# Patient Record
Sex: Female | Born: 1990 | Race: Black or African American | Hispanic: No | Marital: Single | Smoking: Former smoker
Health system: Southern US, Community
[De-identification: ages and names within clinical notes are randomized; demographics above are authoritative.]

## PROBLEM LIST (undated history)

## (undated) ENCOUNTER — Emergency Department (HOSPITAL_COMMUNITY): Admission: EM | Payer: PRIVATE HEALTH INSURANCE | Source: Home / Self Care

## (undated) DIAGNOSIS — D649 Anemia, unspecified: Secondary | ICD-10-CM

## (undated) HISTORY — PX: ADENOIDECTOMY: SUR15

---
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
## 2012-12-14 ENCOUNTER — Encounter (HOSPITAL_COMMUNITY): Payer: Self-pay | Admitting: Obstetrics and Gynecology

## 2012-12-14 ENCOUNTER — Inpatient Hospital Stay (HOSPITAL_COMMUNITY)
Admission: AD | Admit: 2012-12-14 | Discharge: 2012-12-14 | Disposition: A | Discharge status: Home / Self Care | Payer: Self-pay | Source: Ambulatory Visit | Attending: Obstetrics & Gynecology | Admitting: Obstetrics & Gynecology

## 2012-12-14 DIAGNOSIS — A499 Bacterial infection, unspecified: Secondary | ICD-10-CM

## 2012-12-14 DIAGNOSIS — N760 Acute vaginitis: Secondary | ICD-10-CM | POA: Insufficient documentation

## 2012-12-14 DIAGNOSIS — N949 Unspecified condition associated with female genital organs and menstrual cycle: Secondary | ICD-10-CM | POA: Insufficient documentation

## 2012-12-14 DIAGNOSIS — B9689 Other specified bacterial agents as the cause of diseases classified elsewhere: Secondary | ICD-10-CM | POA: Insufficient documentation

## 2012-12-14 LAB — URINALYSIS, ROUTINE W REFLEX MICROSCOPIC
Bilirubin Urine: NEGATIVE
Hgb urine dipstick: NEGATIVE
Ketones, ur: NEGATIVE mg/dL
Protein, ur: NEGATIVE mg/dL
Urobilinogen, UA: 0.2 mg/dL (ref 0.0–1.0)

## 2012-12-14 LAB — WET PREP, GENITAL: Trich, Wet Prep: NONE SEEN

## 2012-12-14 MED ORDER — METRONIDAZOLE 500 MG PO TABS: 500.0000 mg | ORAL_TABLET | Freq: Two times a day (BID) | ORAL | Status: DC

## 2012-12-14 NOTE — MAU Provider Note (Signed)
History     CSN: [id number]  Arrival date and time: 12/14/12 2841   None     Chief Complaint  Patient presents with  . STD eval    . Vaginitis   HPI [name] is a 22 y.o. G0P0 who presents to MAU today for STD check. The patient states that she had unprotected intercourse last Thursday. She has noticed a thin, milky discharge recently without odor. She denies fever, abdominal pain, N/V or abnormal bleeding.   OB History    Grav Para Term Preterm Abortions TAB SAB Ect Mult Living   0 0 0 0 0 0 0 0 0 0       Past Medical History  Diagnosis Date  . Asthma   . Anemia     Past Surgical History  Procedure Date  . Adenoidectomy     History reviewed. No pertinent family history.  History  Substance Use Topics  . Smoking status: Former Games developer  . Smokeless tobacco: Not on file  . Alcohol Use: No    Allergies:  Allergies  Allergen Reactions  . Penicillins Other (See Comments)    Pt unsure of reaction    No prescriptions prior to admission    ROS All negative unless otherwise noted in HPI Physical Exam   Blood pressure 134/76, pulse 79, temperature 98.3 F (36.8 C), temperature source Oral, resp. rate 16, height 5\' 1"  (1.549 m), weight 168 lb (76.204 kg), last menstrual period 11/27/2012.  Physical Exam  Constitutional: She is oriented to person, place, and time. She appears well-developed and well-nourished. No distress.  HENT:  Head: Normocephalic.  Cardiovascular: Normal rate, regular rhythm and normal heart sounds.   Respiratory: Effort normal and breath sounds normal. No respiratory distress.  GI: Soft. Bowel sounds are normal. She exhibits no distension and no mass. There is no tenderness. There is no rebound and no guarding.  Genitourinary: Vagina normal. Uterus is not enlarged and not tender. Cervix exhibits discharge (frothy, thin, whitish, yellow discharge noted at the cervical os) and friability (mild fribility noted on exam. no active  bleeding). Cervix exhibits no motion tenderness. Right adnexum displays no mass and no tenderness. Left adnexum displays no mass and no tenderness.  Neurological: She is alert and oriented to person, place, and time.  Skin: Skin is warm and dry. No erythema.  Psychiatric: She has a normal mood and affect.   Results for orders placed during the hospital encounter of 12/14/12 (from the past 24 hour(s))  URINALYSIS, ROUTINE W REFLEX MICROSCOPIC     Status: Normal   Collection Time   12/14/12 10:00 AM      Component Value Range   Color, Urine YELLOW  YELLOW   APPearance CLEAR  CLEAR   Specific Gravity, Urine 1.020  1.005 - 1.030   pH 6.0  5.0 - 8.0   Glucose, UA NEGATIVE  NEGATIVE mg/dL   Hgb urine dipstick NEGATIVE  NEGATIVE   Bilirubin Urine NEGATIVE  NEGATIVE   Ketones, ur NEGATIVE  NEGATIVE mg/dL   Protein, ur NEGATIVE  NEGATIVE mg/dL   Urobilinogen, UA 0.2  0.0 - 1.0 mg/dL   Nitrite NEGATIVE  NEGATIVE   Leukocytes, UA NEGATIVE  NEGATIVE  WET PREP, GENITAL     Status: Abnormal   Collection Time   12/14/12 10:52 AM      Component Value Range   Yeast Wet Prep HPF POC NONE SEEN  NONE SEEN   Trich, Wet Prep NONE SEEN  NONE SEEN  Clue Cells Wet Prep HPF POC FEW (*) NONE SEEN   WBC, Wet Prep HPF POC MODERATE BACTERIA SEEN (*) NONE SEEN    MAU Course  Procedures None  MDM Wet prep and GC/Chlamydia obtained   Assessment and Plan  A: Bacterial vaginosis  P: Discharge home Patient given contact information for [facility] for additional STD testing GC/Chlamydia pending. Patient aware that she will be contacted with a positive result only Patient encouraged to find a PCP Patient may return to MAU as needed  Freddi Starr, PA-C 12/14/2012, 10:39 AM

## 2012-12-14 NOTE — MAU Note (Signed)
Pt states she was having intercourse last Thursday and her partner took condom off and wants to be evaluated for STDs.

## 2012-12-16 LAB — GC/CHLAMYDIA PROBE AMP
CT Probe RNA: NEGATIVE
GC Probe RNA: NEGATIVE

## 2013-10-06 ENCOUNTER — Other Ambulatory Visit (HOSPITAL_COMMUNITY)
Admission: RE | Admit: 2013-10-06 | Discharge: 2013-10-06 | Disposition: A | Discharge status: Home / Self Care | Payer: PRIVATE HEALTH INSURANCE | Source: Ambulatory Visit | Attending: Emergency Medicine | Admitting: Emergency Medicine

## 2013-10-06 ENCOUNTER — Encounter (HOSPITAL_COMMUNITY): Payer: Self-pay | Admitting: Emergency Medicine

## 2013-10-06 ENCOUNTER — Emergency Department (HOSPITAL_COMMUNITY)
Admission: EM | Admit: 2013-10-06 | Discharge: 2013-10-06 | Disposition: A | Discharge status: Home / Self Care | Payer: PRIVATE HEALTH INSURANCE | Source: Home / Self Care | Attending: Emergency Medicine | Admitting: Emergency Medicine

## 2013-10-06 DIAGNOSIS — K5900 Constipation, unspecified: Secondary | ICD-10-CM

## 2013-10-06 DIAGNOSIS — S335XXA Sprain of ligaments of lumbar spine, initial encounter: Secondary | ICD-10-CM

## 2013-10-06 DIAGNOSIS — Z113 Encounter for screening for infections with a predominantly sexual mode of transmission: Secondary | ICD-10-CM | POA: Insufficient documentation

## 2013-10-06 DIAGNOSIS — N760 Acute vaginitis: Secondary | ICD-10-CM

## 2013-10-06 DIAGNOSIS — K2970 Gastritis, unspecified, without bleeding: Secondary | ICD-10-CM

## 2013-10-06 DIAGNOSIS — S39012A Strain of muscle, fascia and tendon of lower back, initial encounter: Secondary | ICD-10-CM

## 2013-10-06 DIAGNOSIS — B9689 Other specified bacterial agents as the cause of diseases classified elsewhere: Secondary | ICD-10-CM

## 2013-10-06 DIAGNOSIS — A499 Bacterial infection, unspecified: Secondary | ICD-10-CM

## 2013-10-06 LAB — POCT I-STAT, CHEM 8
BUN: 12 mg/dL (ref 6–23)
Hemoglobin: 15.3 g/dL — ABNORMAL HIGH (ref 12.0–15.0)
Potassium: 3.7 mEq/L (ref 3.5–5.1)
Sodium: 142 mEq/L (ref 135–145)
TCO2: 24 mmol/L (ref 0–100)

## 2013-10-06 LAB — POCT URINALYSIS DIP (DEVICE)
Bilirubin Urine: NEGATIVE
Ketones, ur: NEGATIVE mg/dL
Protein, ur: NEGATIVE mg/dL

## 2013-10-06 LAB — POCT PREGNANCY, URINE: Preg Test, Ur: NEGATIVE

## 2013-10-06 MED ORDER — CYCLOBENZAPRINE HCL 5 MG PO TABS: 5.0000 mg | ORAL_TABLET | Freq: Three times a day (TID) | ORAL | Status: DC | PRN

## 2013-10-06 MED ORDER — OMEPRAZOLE 20 MG PO CPDR: 20.0000 mg | DELAYED_RELEASE_CAPSULE | Freq: Two times a day (BID) | ORAL | Status: DC

## 2013-10-06 MED ORDER — METRONIDAZOLE 500 MG PO TABS: 500.0000 mg | ORAL_TABLET | Freq: Two times a day (BID) | ORAL | Status: DC

## 2013-10-06 MED ORDER — POLYETHYLENE GLYCOL 3350 17 GM/SCOOP PO POWD: 17.0000 g | Freq: Every day | ORAL | Status: DC

## 2013-10-06 NOTE — ED Notes (Signed)
Pelvic equipment at bedside, patient instructed to put on gown 

## 2013-10-06 NOTE — ED Provider Notes (Signed)
Chief Complaint:   Chief Complaint  Patient presents with  . Abdominal Pain  . Back Pain    History of Present Illness:    Amy Pacheco is a 22 year old female who for the past month has had continuous nausea throughout the day and has lost about 10 pounds. She's been constipated only having about one to 2 bowel movements per week which are hard and pellet-like. She does not have to strain. She denies any blood in the stool. She has had no vomiting. She feels hunger pains in the epigastric area. She denies any other abdominal pain. The symptoms came on after the following weekend in which she and dull is heavily in alcohol. She has not had any alcohol since then, but the symptoms have persisted. She does not smoke cigarettes but does occasionally smoke marijuana. No use of aspirin, or anti-inflammatories. She was drinking coffee but stopped this. Also for the past 1-1/2 weeks she's had pain in her lower back. This came on after gym workout. It's localized to the right lower back. It hurts with bending and twisting. There is no radiation to the legs, numbness, tingling, or weakness. She has swelling of her ankles and feet for the past 2 weeks. Her urine smells strong. She's had some discharge and odor. Last menstrual period was October 29. She is sexually active without use of birth control. She denies any history of ulcer disease or gastritis.  Review of Systems:  Other than noted above, the patient denies any of the following symptoms: Constitutional:  No fever, chills, fatigue, weight loss or anorexia. Lungs:  No cough or shortness of breath. Heart:  No chest pain, palpitations, syncope or edema.  No cardiac history. Abdomen:  No nausea, vomiting, hematememesis, melena, diarrhea, or hematochezia. GU:  No dysuria, frequency, urgency, or hematuria. Gyn:  No vaginal discharge, itching, abnormal bleeding, dyspareunia, or pelvic pain.  PMFSH:  Past medical history, family history, social history, meds,  and allergies were reviewed along with nurse's notes.  No prior abdominal surgeries, past history of GI problems, STDs or GYN problems.  No history of aspirin or NSAID use.  No excessive alcohol intake. She is allergic to penicillin.  Physical Exam:   Vital signs:  BP 109/68  Pulse 64  Temp(Src) 98.8 F (37.1 C) (Oral)  Resp 16  SpO2 100%  LMP 09/10/2013 Gen:  Alert, oriented, in no distress. Lungs:  Breath sounds clear and equal bilaterally.  No wheezes, rales or rhonchi. Heart:  Regular rhythm.  No gallops or murmers.   Abdomen:  Soft, flat, nondistended. No organomegaly or mass. Bowel sounds are normally active. No tenderness, guarding, rebound. Pelvic:  Normal external genitalia. Vaginal and cervical mucosa were normal. There was a small amount of yellow, malodorous discharge. There was no pain on cervical motion. Uterus was normal in size and shape and nontender. No adnexal tenderness or mass. DNA probes for gonorrhea, Chlamydia, Trichomonas, Gardnerella, Candida were obtained. In Skin:  Clear, warm and dry.  No rash.  Labs:   Results for orders placed during the hospital encounter of 10/06/13  POCT URINALYSIS DIP (DEVICE)      Result Value Range   Glucose, UA NEGATIVE  NEGATIVE mg/dL   Bilirubin Urine NEGATIVE  NEGATIVE   Ketones, ur NEGATIVE  NEGATIVE mg/dL   Specific Gravity, Urine >=1.030  1.005 - 1.030   Hgb urine dipstick NEGATIVE  NEGATIVE   pH 6.5  5.0 - 8.0   Protein, ur NEGATIVE  NEGATIVE mg/dL  Urobilinogen, UA 0.2  0.0 - 1.0 mg/dL   Nitrite NEGATIVE  NEGATIVE   Leukocytes, UA TRACE (*) NEGATIVE  POCT PREGNANCY, URINE      Result Value Range   Preg Test, Ur NEGATIVE  NEGATIVE  POCT H PYLORI SCREEN      Result Value Range   H. PYLORI SCREEN, POC NEGATIVE  NEGATIVE  POCT I-STAT, CHEM 8      Result Value Range   Sodium 142  135 - 145 mEq/L   Potassium 3.7  3.5 - 5.1 mEq/L   Chloride 104  96 - 112 mEq/L   BUN 12  6 - 23 mg/dL   Creatinine, Ser 1.61  0.50 - 1.10  mg/dL   Glucose, Bld 74  70 - 99 mg/dL   Calcium, Ion 0.96 (*) 1.12 - 1.23 mmol/L   TCO2 24  0 - 100 mmol/L   Hemoglobin 15.3 (*) 12.0 - 15.0 g/dL   HCT 04.5  40.9 - 81.1 %    Assessment:  The primary encounter diagnosis was Gastritis. Diagnoses of Lumbar strain, initial encounter, Bacterial vaginosis, and Constipation were also pertinent to this visit.  Differential diagnoses of her nausea and epigastric discomfort is ulcer versus gastritis. She'll need to see a GI doctor her primary care Dr. in followup. 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 She prefers to go there for followup.  Plan:   1.  Meds:  The following meds were prescribed:   Discharge Medication List as of 10/06/2013  1:58 PM    START taking these medications   Details  cyclobenzaprine (FLEXERIL) 5 MG tablet Take 1 tablet (5 mg total) by mouth 3 (three) times daily as needed for muscle spasms., Starting 10/06/2013, Until Discontinued, Normal    !! metroNIDAZOLE (FLAGYL) 500 MG tablet Take 1 tablet (500 mg total) by mouth 2 (two) times daily., Starting 10/06/2013, Until Discontinued, Normal    omeprazole (PRILOSEC) 20 MG capsule Take 1 capsule (20 mg total) by mouth 2 (two) times daily before a meal., Starting 10/06/2013, Until Discontinued, Normal    polyethylene glycol powder (MIRALAX) powder Take 17 g by mouth daily., Starting 10/06/2013, Until Discontinued, Normal     !! - Potential duplicate medications found. Please discuss with provider.      2.  Patient Education/Counseling:  The patient was given appropriate handouts, self care instructions, and instructed in symptomatic relief.    3.  Follow up:  The patient was told to follow up if no better in 3 to 4 days, if becoming worse in any way, and given some red flag symptoms such as worsening abdominal pain or persistent vomiting or any evidence of GI bleeding which would prompt immediate return.  Follow up here if  necessary.    Reuben Likes, MD 10/06/13 [phone]

## 2013-10-06 NOTE — ED Notes (Signed)
Patient reports multiple complaints.  Onset around halloween of nausea, no vomiting, no diarrhea.  Patient also reports last bm approx 2 weeks ago, normally has bm 2 times/week.  Patient also has right lower back pain, no burning with urination , but has an odor, and reports vaginal discharge.

## 2013-10-07 ENCOUNTER — Telehealth (HOSPITAL_COMMUNITY): Payer: Self-pay | Admitting: Emergency Medicine

## 2013-10-07 ENCOUNTER — Telehealth (HOSPITAL_COMMUNITY): Payer: Self-pay | Admitting: *Deleted

## 2013-10-07 MED ORDER — AZITHROMYCIN 500 MG PO TABS: 1000.0000 mg | ORAL_TABLET | Freq: Once | ORAL | Status: DC

## 2013-10-07 NOTE — ED Notes (Signed)
GC neg., Chlamydia pos., Affirm: Candida and Trich neg., Gardnerella pos. Pt. adequately treated with Flagyl.  Dr. Lorenz Coaster e-prescribed Zithromax to [facility] on [address]. I called pt. Pt. verified x 2 and given results.  Pt. told she was adequately treated for bacterial vaginosis with Flagyl and needs Zithromax for Chlamydia. Pt. instructed to notify her partner, no sex for 1 week and to practice safe sex. Pt. told she can get HIV testing at the [facility]. STD clinic, by appointment.  DHHS form completed and faxed to the [facility] Department. Vassie Moselle 10/07/2013

## 2013-10-07 NOTE — ED Notes (Signed)
The patient's DNA probe came back positive for Chlamydia. She was not treated for this. We will send in a prescription to her pharmacy for azithromycin 500 mg, #2 tablets, to take 2 at 1 time. Would suggest a repeat DNA probe in 2 weeks, and for her partner, and we'll need to report to the health department.  Reuben Likes, MD 10/07/13 [phone]

## 2014-03-18 ENCOUNTER — Emergency Department (INDEPENDENT_AMBULATORY_CARE_PROVIDER_SITE_OTHER)
Admission: EM | Admit: 2014-03-18 | Discharge: 2014-03-18 | Disposition: A | Discharge status: Home / Self Care | Payer: PRIVATE HEALTH INSURANCE | Source: Home / Self Care | Attending: Family Medicine | Admitting: Family Medicine

## 2014-03-18 ENCOUNTER — Encounter (HOSPITAL_COMMUNITY): Payer: Self-pay | Admitting: Emergency Medicine

## 2014-03-18 DIAGNOSIS — K2920 Alcoholic gastritis without bleeding: Secondary | ICD-10-CM

## 2014-03-18 MED ORDER — SODIUM CHLORIDE 0.9 % IV SOLN
Freq: Once | INTRAVENOUS | Status: AC
  Administered 2014-03-18: 16:00:00 via INTRAVENOUS

## 2014-03-18 MED ORDER — ONDANSETRON HCL 4 MG/2ML IJ SOLN
4.0000 mg | Freq: Once | INTRAMUSCULAR | Status: AC
  Administered 2014-03-18: 4 mg via INTRAVENOUS

## 2014-03-18 MED ORDER — GI COCKTAIL ~~LOC~~
30.0000 mL | Freq: Once | ORAL | Status: AC
  Administered 2014-03-18: 30 mL via ORAL

## 2014-03-18 MED ORDER — GI COCKTAIL ~~LOC~~
ORAL | Status: AC
  Filled 2014-03-18: qty 30

## 2014-03-18 MED ORDER — ONDANSETRON 4 MG PO TBDP: 8.0000 mg | ORAL_TABLET | Freq: Once | ORAL | Status: DC

## 2014-03-18 MED ORDER — ONDANSETRON HCL 4 MG/2ML IJ SOLN
INTRAMUSCULAR | Status: AC
  Filled 2014-03-18: qty 2

## 2014-03-18 MED ORDER — RANITIDINE HCL 150 MG PO TABS: 150.0000 mg | ORAL_TABLET | Freq: Two times a day (BID) | ORAL | Status: DC

## 2014-03-18 NOTE — ED Provider Notes (Signed)
CSN: [id number]     Arrival date & time 03/18/14  1440 History   First MD Initiated Contact with Patient 03/18/14 1511     Chief Complaint  Patient presents with  . Alcohol Problem   (Consider location/radiation/quality/duration/timing/severity/associated sxs/prior Treatment) Patient is a 23 y.o. female presenting with alcohol problem. The history is provided by the patient.  Alcohol Problem This is a new problem. The current episode started 6 to 12 hours ago (this am after heavy drinking binge yest into night. no blood.). The problem occurs constantly. Pertinent negatives include no abdominal pain.    Past Medical History  Diagnosis Date  . Asthma   . Anemia    Past Surgical History  Procedure Laterality Date  . Adenoidectomy     History reviewed. No pertinent family history. History  Substance Use Topics  . Smoking status: Former Research scientist (life sciences)  . Smokeless tobacco: Not on file  . Alcohol Use: Yes     Comment: binge consumption   OB History   Grav Para Term Preterm Abortions TAB SAB Ect Mult Living   0 0 0 0 0 0 0 0 0 0      Review of Systems  Constitutional: Positive for appetite change. Negative for fever.  Gastrointestinal: Positive for nausea and vomiting. Negative for abdominal pain, diarrhea and blood in stool.    Allergies  Penicillins  Home Medications   Prior to Admission medications   Medication Sig Start Date End Date Taking? Authorizing Provider  azithromycin (ZITHROMAX) 500 MG tablet Take 2 tablets (1,000 mg total) by mouth once. 10/07/13   Harden Mo, MD  cyclobenzaprine (FLEXERIL) 5 MG tablet Take 1 tablet (5 mg total) by mouth 3 (three) times daily as needed for muscle spasms. 10/06/13   Harden Mo, MD  metroNIDAZOLE (FLAGYL) 500 MG tablet Take 1 tablet (500 mg total) by mouth 2 (two) times daily. 12/14/12   Farris Has, PA-C  metroNIDAZOLE (FLAGYL) 500 MG tablet Take 1 tablet (500 mg total) by mouth 2 (two) times daily. 10/06/13   Harden Mo, MD   omeprazole (PRILOSEC) 20 MG capsule Take 1 capsule (20 mg total) by mouth 2 (two) times daily before a meal. 10/06/13   Harden Mo, MD  polyethylene glycol powder (MIRALAX) powder Take 17 g by mouth daily. 10/06/13   Harden Mo, MD   BP 125/83  Pulse 115  Temp(Src) 98.1 F (36.7 C) (Oral)  Resp 19  SpO2 100% Physical Exam  Nursing note and vitals reviewed. Constitutional: She is oriented to person, place, and time. She appears well-developed and well-nourished. She appears distressed.  HENT:  Mouth/Throat: Oropharynx is clear and moist.  Neck: Normal range of motion. Neck supple.  Abdominal: Soft. Bowel sounds are normal. She exhibits no mass. There is tenderness. There is no rebound and no guarding.  Lymphadenopathy:    She has no cervical adenopathy.  Neurological: She is alert and oriented to person, place, and time.  Skin: Skin is warm and dry.    ED Course  Procedures (including critical care time) Labs Review Labs Reviewed - No data to display  Imaging Review No results found.   MDM   1. Gastritis, alcoholic    Tolerating po and sx improved at time of d/c.    Billy Fischer, MD 03/18/14 [phone]

## 2014-03-18 NOTE — ED Notes (Signed)
Reportedly consumed couple of beers and did a few shots yesterday PM in a group. No one else in group ill. C/o has not  Been able to hold down any fluids

## 2014-03-18 NOTE — Discharge Instructions (Signed)
Use medicine as prescribed, you MUST reduce alcohol intake or problems WILL get worse.

## 2014-11-28 ENCOUNTER — Emergency Department (HOSPITAL_COMMUNITY): Payer: No Typology Code available for payment source

## 2014-11-28 ENCOUNTER — Emergency Department (HOSPITAL_COMMUNITY)
Admission: EM | Admit: 2014-11-28 | Discharge: 2014-11-28 | Disposition: A | Discharge status: Home / Self Care | Payer: No Typology Code available for payment source | Attending: Emergency Medicine | Admitting: Emergency Medicine

## 2014-11-28 ENCOUNTER — Encounter (HOSPITAL_COMMUNITY): Payer: Self-pay | Admitting: *Deleted

## 2014-11-28 DIAGNOSIS — N939 Abnormal uterine and vaginal bleeding, unspecified: Secondary | ICD-10-CM | POA: Insufficient documentation

## 2014-11-28 DIAGNOSIS — Z3202 Encounter for pregnancy test, result negative: Secondary | ICD-10-CM | POA: Insufficient documentation

## 2014-11-28 DIAGNOSIS — M545 Low back pain: Secondary | ICD-10-CM | POA: Insufficient documentation

## 2014-11-28 DIAGNOSIS — Z880 Allergy status to penicillin: Secondary | ICD-10-CM | POA: Insufficient documentation

## 2014-11-28 DIAGNOSIS — Z87891 Personal history of nicotine dependence: Secondary | ICD-10-CM | POA: Insufficient documentation

## 2014-11-28 DIAGNOSIS — Z862 Personal history of diseases of the blood and blood-forming organs and certain disorders involving the immune mechanism: Secondary | ICD-10-CM | POA: Insufficient documentation

## 2014-11-28 DIAGNOSIS — R1030 Lower abdominal pain, unspecified: Secondary | ICD-10-CM

## 2014-11-28 DIAGNOSIS — J45909 Unspecified asthma, uncomplicated: Secondary | ICD-10-CM | POA: Insufficient documentation

## 2014-11-28 LAB — CBC WITH DIFFERENTIAL/PLATELET
BASOS ABS: 0.1 10*3/uL (ref 0.0–0.1)
Basophils Relative: 1 % (ref 0–1)
EOS ABS: 0.1 10*3/uL (ref 0.0–0.7)
Eosinophils Relative: 1 % (ref 0–5)
HCT: 38.1 % (ref 36.0–46.0)
Hemoglobin: 12.8 g/dL (ref 12.0–15.0)
LYMPHS ABS: 1.7 10*3/uL (ref 0.7–4.0)
Lymphocytes Relative: 24 % (ref 12–46)
MCH: 28.8 pg (ref 26.0–34.0)
MCHC: 33.6 g/dL (ref 30.0–36.0)
MCV: 85.6 fL (ref 78.0–100.0)
MONO ABS: 0.6 10*3/uL (ref 0.1–1.0)
Monocytes Relative: 8 % (ref 3–12)
NEUTROS ABS: 4.7 10*3/uL (ref 1.7–7.7)
Neutrophils Relative %: 66 % (ref 43–77)
Platelets: 454 10*3/uL — ABNORMAL HIGH (ref 150–400)
RBC: 4.45 MIL/uL (ref 3.87–5.11)
RDW: 15.8 % — ABNORMAL HIGH (ref 11.5–15.5)
WBC: 7.1 10*3/uL (ref 4.0–10.5)

## 2014-11-28 LAB — COMPREHENSIVE METABOLIC PANEL
ALBUMIN: 4.1 g/dL (ref 3.5–5.2)
ALK PHOS: 56 U/L (ref 39–117)
ALT: 17 U/L (ref 0–35)
ANION GAP: 5 (ref 5–15)
AST: 20 U/L (ref 0–37)
BUN: <5 mg/dL — ABNORMAL LOW (ref 6–23)
CALCIUM: 9.5 mg/dL (ref 8.4–10.5)
CHLORIDE: 105 meq/L (ref 96–112)
CO2: 28 mmol/L (ref 19–32)
Creatinine, Ser: 0.68 mg/dL (ref 0.50–1.10)
GFR calc non Af Amer: >90 mL/min (ref >90)
Glucose, Bld: 106 mg/dL — ABNORMAL HIGH (ref 70–99)
Potassium: 3.8 mmol/L (ref 3.5–5.1)
SODIUM: 138 mmol/L (ref 135–145)
TOTAL PROTEIN: 7.7 g/dL (ref 6.0–8.3)
Total Bilirubin: 1 mg/dL (ref 0.3–1.2)

## 2014-11-28 LAB — URINALYSIS, ROUTINE W REFLEX MICROSCOPIC
Bilirubin Urine: NEGATIVE
Glucose, UA: NEGATIVE mg/dL
NITRITE: NEGATIVE
PROTEIN: NEGATIVE mg/dL
Specific Gravity, Urine: 1.028 (ref 1.005–1.030)
UROBILINOGEN UA: 0.2 mg/dL (ref 0.0–1.0)
pH: 5.5 (ref 5.0–8.0)

## 2014-11-28 LAB — PREGNANCY, URINE: PREG TEST UR: NEGATIVE

## 2014-11-28 LAB — URINE MICROSCOPIC-ADD ON

## 2014-11-28 LAB — WET PREP, GENITAL
CLUE CELLS WET PREP: NONE SEEN
Trich, Wet Prep: NONE SEEN
Yeast Wet Prep HPF POC: NONE SEEN

## 2014-11-28 LAB — LIPASE, BLOOD: Lipase: 35 U/L (ref 11–59)

## 2014-11-28 MED ORDER — HYDROCODONE-ACETAMINOPHEN 5-325 MG PO TABS: 2.0000 | ORAL_TABLET | ORAL | Status: AC | PRN

## 2014-11-28 MED ORDER — IBUPROFEN 800 MG PO TABS
800.0000 mg | ORAL_TABLET | Freq: Once | ORAL | Status: AC
  Administered 2014-11-28: 800 mg via ORAL
  Filled 2014-11-28: qty 1

## 2014-11-28 MED ORDER — HYDROCODONE-ACETAMINOPHEN 5-325 MG PO TABS
2.0000 | ORAL_TABLET | Freq: Once | ORAL | Status: DC
  Filled 2014-11-28: qty 2

## 2014-11-28 MED ORDER — ONDANSETRON 4 MG PO TBDP
8.0000 mg | ORAL_TABLET | Freq: Once | ORAL | Status: AC
  Administered 2014-11-28: 8 mg via ORAL
  Filled 2014-11-28: qty 2

## 2014-11-28 MED ORDER — IBUPROFEN 800 MG PO TABS: 800.0000 mg | ORAL_TABLET | Freq: Three times a day (TID) | ORAL | Status: AC

## 2014-11-28 NOTE — ED Provider Notes (Signed)
CSN: [id number]     Arrival date & time 11/28/14  0320 History   First MD Initiated Contact with Patient 11/28/14 [phone]     Chief Complaint  Patient presents with  . Abdominal Pain     (Consider location/radiation/quality/duration/timing/severity/associated sxs/prior Treatment) HPI Comments: Patient is a 24 year old female with no past medical history who presents with lower abdominal pain that started yesterday. The pain is located in her lower abdomen and does not radiate. Patient reports her menstrual cycle started yesterday. The pain is described as cramping and severe. The pain started gradually and progressively worsened since the onset. No alleviating/aggravating factors. The patient has tried tylenol for symptoms without relief. Associated symptoms include back pain and heavy vaginal bleeding. Patient denies fever, headache, NVD, chest pain, SOB, dysuria, constipation.    Past Medical History  Diagnosis Date  . Asthma   . Anemia    Past Surgical History  Procedure Laterality Date  . Adenoidectomy     No family history on file. History  Substance Use Topics  . Smoking status: Former Research scientist (life sciences)  . Smokeless tobacco: Not on file  . Alcohol Use: Yes     Comment: binge consumption   OB History    Gravida Para Term Preterm AB TAB SAB Ectopic Multiple Living   0 0 0 0 0 0 0 0 0 0      Review of Systems  Constitutional: Negative for fever, chills and fatigue.  HENT: Negative for trouble swallowing.   Eyes: Negative for visual disturbance.  Respiratory: Negative for shortness of breath.   Cardiovascular: Negative for chest pain and palpitations.  Gastrointestinal: Positive for abdominal pain. Negative for nausea, vomiting and diarrhea.  Genitourinary: Positive for vaginal bleeding. Negative for dysuria and difficulty urinating.  Musculoskeletal: Positive for back pain. Negative for arthralgias and neck pain.  Skin: Negative for color change.  Neurological: Negative for dizziness  and weakness.  Psychiatric/Behavioral: Negative for dysphoric mood.      Allergies  Penicillins  Home Medications   Prior to Admission medications   Medication Sig Start Date End Date Taking? Authorizing Provider  azithromycin (ZITHROMAX) 500 MG tablet Take 2 tablets (1,000 mg total) by mouth once. Patient not taking: Reported on 11/28/2014 10/07/13   Harden Mo, MD  cyclobenzaprine (FLEXERIL) 5 MG tablet Take 1 tablet (5 mg total) by mouth 3 (three) times daily as needed for muscle spasms. Patient not taking: Reported on 11/28/2014 10/06/13   Harden Mo, MD  metroNIDAZOLE (FLAGYL) 500 MG tablet Take 1 tablet (500 mg total) by mouth 2 (two) times daily. Patient not taking: Reported on 11/28/2014 12/14/12   Luvenia Redden, PA-C  metroNIDAZOLE (FLAGYL) 500 MG tablet Take 1 tablet (500 mg total) by mouth 2 (two) times daily. Patient not taking: Reported on 11/28/2014 10/06/13   Harden Mo, MD  omeprazole (PRILOSEC) 20 MG capsule Take 1 capsule (20 mg total) by mouth 2 (two) times daily before a meal. Patient not taking: Reported on 11/28/2014 10/06/13   Harden Mo, MD  polyethylene glycol powder (MIRALAX) powder Take 17 g by mouth daily. Patient not taking: Reported on 11/28/2014 10/06/13   Harden Mo, MD  ranitidine (ZANTAC) 150 MG tablet Take 1 tablet (150 mg total) by mouth 2 (two) times daily. Patient not taking: Reported on 11/28/2014 03/18/14   Billy Fischer, MD   BP 134/86 mmHg  Pulse 68  Temp(Src) 98.2 F (36.8 C) (Oral)  Resp 22  Ht 5' (1.524  m)  Wt 180 lb (81.647 kg)  BMI 35.15 kg/m2  SpO2 100%  LMP 11/28/2014 Physical Exam  Constitutional: She is oriented to person, place, and time. She appears well-developed and well-nourished. No distress.  HENT:  Head: Normocephalic and atraumatic.  Eyes: Conjunctivae and EOM are normal.  Neck: Normal range of motion.  Cardiovascular: Normal rate and regular rhythm.  Exam reveals no gallop and no friction rub.   No  murmur heard. Pulmonary/Chest: Effort normal and breath sounds normal. She has no wheezes. She has no rales. She exhibits no tenderness.  Abdominal: Soft. She exhibits no distension. There is tenderness. There is no rebound and no guarding.  Generalized lower abdominal tenderness to palpation. No focal tenderness.   Genitourinary: Vagina normal.  Normal external genitalia. Copious blood in vaginal vault. No CMT. Cervical os closed. Generalized tenderness to palpation on bimanual exam. No focal tenderness or masses palpated.   No CVA tenderness.  Musculoskeletal: Normal range of motion.  Neurological: She is alert and oriented to person, place, and time. Coordination normal.  Speech is goal-oriented. Moves limbs without ataxia.   Skin: Skin is warm and dry.  Psychiatric: She has a normal mood and affect. Her behavior is normal.  Nursing note and vitals reviewed.   ED Course  Procedures (including critical care time) Labs Review Labs Reviewed  WET PREP, GENITAL - Abnormal; Notable for the following:    WBC, Wet Prep HPF POC FEW (*)    All other components within normal limits  CBC WITH DIFFERENTIAL - Abnormal; Notable for the following:    RDW 15.8 (*)    Platelets 454 (*)    All other components within normal limits  COMPREHENSIVE METABOLIC PANEL - Abnormal; Notable for the following:    Glucose, Bld 106 (*)    BUN <5 (*)    All other components within normal limits  URINALYSIS, ROUTINE W REFLEX MICROSCOPIC - Abnormal; Notable for the following:    Color, Urine AMBER (*)    APPearance TURBID (*)    Hgb urine dipstick LARGE (*)    Ketones, ur >80 (*)    Leukocytes, UA TRACE (*)    All other components within normal limits  LIPASE, BLOOD  PREGNANCY, URINE  URINE MICROSCOPIC-ADD ON  GC/CHLAMYDIA PROBE AMP ([place])    Imaging Review No results found.   EKG Interpretation None      MDM   Final diagnoses:  Lower abdominal pain  Episode of heavy vaginal bleeding     5:37 AM Labs unremarkable for acute changes. Patient's pelvic exam shows copious blood. Patient will have pelvis US. Vitals stable and patient afebrile.    Patient signed out to [facility], PA-C pending pelvic US.   Alvina Chou, PA-C 11/30/14 [place], MD 12/02/14 [phone]

## 2014-11-28 NOTE — ED Notes (Signed)
Pelvic cart at bedside. 

## 2014-11-28 NOTE — Discharge Instructions (Signed)
Is important if you to follow-up with primary care and GYN for further evaluation and management of your symptoms. Please take your Motrin regularly as prescribed for discomfort he may experience. He may take your pain medicine for severe pain. Return to ED for worsening symptoms.

## 2014-11-28 NOTE — ED Provider Notes (Signed)
Patient signed out to me by [facility]. Pt here for heavy menstrual bleeding. Plan is to follow-up on vaginal ultrasound, if normal patient may be discharged home with pain medicines and referral to follow-up with GYN. Ultrasound shows evidence of large 8.5 cm heterogeneous uterine fibroid on the right side of the uterus, otherwise unremarkable. Will provide patient with anti-inflammatories, pain medicine and referral to follow up with GYN. Patient stable, in good condition and is appropriate for discharge Filed Vitals:   11/28/14 0325 11/28/14 0434 11/28/14 0435 11/28/14 0644  BP: 111/68 134/86  106/61  Pulse: 87  68 65  Temp: 98.2 F (36.8 C)     TempSrc: Oral     Resp: 22   14  Height: 5' (1.524 m)     Weight: 180 lb (81.647 kg)     SpO2: 98%  100% 99%     Verl Dicker, PA-C 11/28/14 [phone]

## 2014-11-28 NOTE — ED Notes (Signed)
Patient alert and oriented at discharge.  Patient verbalized understanding of discharge.  Patient ambulatory to the waiting room with this RN.

## 2014-11-28 NOTE — ED Notes (Signed)
The pts period started yesterday and she has had ab and back pain since then.  [facility] but the pain  Continues and she was not given med for pain

## 2014-11-30 LAB — GC/CHLAMYDIA PROBE AMP (~~LOC~~) NOT AT ARMC
CHLAMYDIA, DNA PROBE: NEGATIVE
NEISSERIA GONORRHEA: NEGATIVE

## 2014-12-02 ENCOUNTER — Encounter (HOSPITAL_COMMUNITY): Payer: Self-pay | Admitting: *Deleted

## 2014-12-02 ENCOUNTER — Emergency Department (HOSPITAL_COMMUNITY)
Admission: EM | Admit: 2014-12-02 | Discharge: 2014-12-02 | Disposition: A | Discharge status: Home / Self Care | Payer: No Typology Code available for payment source | Attending: Emergency Medicine | Admitting: Emergency Medicine

## 2014-12-02 DIAGNOSIS — Z87891 Personal history of nicotine dependence: Secondary | ICD-10-CM | POA: Diagnosis not present

## 2014-12-02 DIAGNOSIS — Z880 Allergy status to penicillin: Secondary | ICD-10-CM | POA: Diagnosis not present

## 2014-12-02 DIAGNOSIS — J45909 Unspecified asthma, uncomplicated: Secondary | ICD-10-CM | POA: Insufficient documentation

## 2014-12-02 DIAGNOSIS — D259 Leiomyoma of uterus, unspecified: Secondary | ICD-10-CM | POA: Diagnosis not present

## 2014-12-02 DIAGNOSIS — N938 Other specified abnormal uterine and vaginal bleeding: Secondary | ICD-10-CM

## 2014-12-02 DIAGNOSIS — Z791 Long term (current) use of non-steroidal anti-inflammatories (NSAID): Secondary | ICD-10-CM | POA: Insufficient documentation

## 2014-12-02 DIAGNOSIS — Z862 Personal history of diseases of the blood and blood-forming organs and certain disorders involving the immune mechanism: Secondary | ICD-10-CM | POA: Diagnosis not present

## 2014-12-02 DIAGNOSIS — R109 Unspecified abdominal pain: Secondary | ICD-10-CM | POA: Diagnosis present

## 2014-12-02 LAB — I-STAT BETA HCG BLOOD, ED (MC, WL, AP ONLY): I-stat hCG, quantitative: <5 m[IU]/mL (ref <5)

## 2014-12-02 LAB — CBC
HCT: 35.9 % — ABNORMAL LOW (ref 36.0–46.0)
Hemoglobin: 12.1 g/dL (ref 12.0–15.0)
MCH: 28.9 pg (ref 26.0–34.0)
MCHC: 33.7 g/dL (ref 30.0–36.0)
MCV: 85.7 fL (ref 78.0–100.0)
PLATELETS: 458 10*3/uL — AB (ref 150–400)
RBC: 4.19 MIL/uL (ref 3.87–5.11)
RDW: 15.7 % — ABNORMAL HIGH (ref 11.5–15.5)
WBC: 7.9 10*3/uL (ref 4.0–10.5)

## 2014-12-02 MED ORDER — OXYCODONE-ACETAMINOPHEN 5-325 MG PO TABS: 1.0000 | ORAL_TABLET | ORAL | Status: AC | PRN

## 2014-12-02 MED ORDER — OXYCODONE-ACETAMINOPHEN 5-325 MG PO TABS
2.0000 | ORAL_TABLET | Freq: Once | ORAL | Status: AC
  Administered 2014-12-02: 2 via ORAL
  Filled 2014-12-02: qty 2

## 2014-12-02 MED ORDER — MEDROXYPROGESTERONE ACETATE 10 MG PO TABS: 10.0000 mg | ORAL_TABLET | Freq: Every day | ORAL | Status: AC

## 2014-12-02 MED ORDER — ONDANSETRON 4 MG PO TBDP
4.0000 mg | ORAL_TABLET | Freq: Once | ORAL | Status: AC
  Administered 2014-12-02: 4 mg via ORAL
  Filled 2014-12-02: qty 1

## 2014-12-02 NOTE — ED Notes (Signed)
Patient states she was dx with fibroid tumor on Saturday.  She states she has had ongoing pain and passing blood clots.  She has been taking ibuprofen and vicodin.  Patient also reports she has had no appetite and some n/v.

## 2014-12-02 NOTE — ED Notes (Signed)
Patient reports no pain.  [facility] is [facility]

## 2014-12-02 NOTE — ED Provider Notes (Signed)
CSN: [id number]     Arrival date & time 12/02/14  1030 History   First MD Initiated Contact with Patient 12/02/14 1034     Chief Complaint  Patient presents with  . Abdominal Pain      HPI Patient reports that she was recently diagnosed with uterine fibroid in his had no follow-up with OB/GYN to this point.  She states that she has developed ongoing vaginal bleeding and passing small blood clots over the past several days.  She states decreased appetite with some nausea and vomiting.  She denies nausea at this time.  Her symptoms are mild to moderate in severity.  No lightheadedness or weakness.  She is not on an anticoagulant.  No other significant complaints.  No fevers or chills.  No back pain.  No vaginal discharge   Past Medical History  Diagnosis Date  . Asthma   . Anemia    Past Surgical History  Procedure Laterality Date  . Adenoidectomy     No family history on file. History  Substance Use Topics  . Smoking status: Former Research scientist (life sciences)  . Smokeless tobacco: Not on file  . Alcohol Use: Yes     Comment: binge consumption   OB History    Gravida Para Term Preterm AB TAB SAB Ectopic Multiple Living   0 0 0 0 0 0 0 0 0 0      Review of Systems  All other systems reviewed and are negative.     Allergies  Penicillins  Home Medications   Prior to Admission medications   Medication Sig Start Date End Date Taking? Authorizing Provider  azithromycin (ZITHROMAX) 500 MG tablet Take 2 tablets (1,000 mg total) by mouth once. Patient not taking: Reported on 11/28/2014 10/07/13   Harden Mo, MD  cyclobenzaprine (FLEXERIL) 5 MG tablet Take 1 tablet (5 mg total) by mouth 3 (three) times daily as needed for muscle spasms. Patient not taking: Reported on 11/28/2014 10/06/13   Harden Mo, MD  HYDROcodone-acetaminophen (NORCO/VICODIN) 5-325 MG per tablet Take 2 tablets by mouth every 4 (four) hours as needed for moderate pain or severe pain. 11/28/14   Viona Gilmore Cartner, PA-C   ibuprofen (ADVIL,MOTRIN) 800 MG tablet Take 1 tablet (800 mg total) by mouth 3 (three) times daily. 11/28/14   Verl Dicker, PA-C  medroxyPROGESTERone (PROVERA) 10 MG tablet Take 1 tablet (10 mg total) by mouth daily. 12/02/14   Hoy Morn, MD  metroNIDAZOLE (FLAGYL) 500 MG tablet Take 1 tablet (500 mg total) by mouth 2 (two) times daily. Patient not taking: Reported on 11/28/2014 12/14/12   Luvenia Redden, PA-C  metroNIDAZOLE (FLAGYL) 500 MG tablet Take 1 tablet (500 mg total) by mouth 2 (two) times daily. Patient not taking: Reported on 11/28/2014 10/06/13   Harden Mo, MD  omeprazole (PRILOSEC) 20 MG capsule Take 1 capsule (20 mg total) by mouth 2 (two) times daily before a meal. Patient not taking: Reported on 11/28/2014 10/06/13   Harden Mo, MD  oxyCODONE-acetaminophen (PERCOCET/ROXICET) 5-325 MG per tablet Take 1 tablet by mouth every 4 (four) hours as needed for severe pain. 12/02/14   Hoy Morn, MD  polyethylene glycol powder [facility]) powder Take 17 g by mouth daily. Patient not taking: Reported on 11/28/2014 10/06/13   Harden Mo, MD  ranitidine (ZANTAC) 150 MG tablet Take 1 tablet (150 mg total) by mouth 2 (two) times daily. Patient not taking: Reported on 11/28/2014 03/18/14   Billy Fischer,  MD   BP 118/62 mmHg  Pulse 87  Temp(Src) 99.2 F (37.3 C) (Oral)  Resp 28  Ht 5' (1.524 m)  Wt 174 lb (78.926 kg)  BMI 33.98 kg/m2  SpO2 100%  LMP 11/28/2014 Physical Exam  Constitutional: She is oriented to person, place, and time. She appears well-developed and well-nourished. No distress.  HENT:  Head: Normocephalic and atraumatic.  Eyes: EOM are normal.  Neck: Normal range of motion.  Cardiovascular: Normal rate, regular rhythm and normal heart sounds.   Pulmonary/Chest: Effort normal and breath sounds normal.  Abdominal: Soft. She exhibits no distension. There is no tenderness.  Musculoskeletal: Normal range of motion.  Neurological: She is alert and oriented  to person, place, and time.  Skin: Skin is warm and dry.  Psychiatric: She has a normal mood and affect. Judgment normal.  Nursing note and vitals reviewed.   ED Course  Procedures (including critical care time) Labs Review Labs Reviewed  CBC - Abnormal; Notable for the following:    HCT 35.9 (*)    RDW 15.7 (*)    Platelets 458 (*)    All other components within normal limits  I-STAT BETA HCG BLOOD, ED ([facility], [facility], [facility] ONLY)    Imaging Review No results found.   EKG Interpretation None      MDM   Final diagnoses:  Dysfunctional uterine bleeding  Uterine leiomyoma, unspecified location    Vital signs normal.  Hemoglobin normal.  Well-appearing.  Abdomen is nontender.  Not pregnant.  We'll place on a course of Provera.  OB/GYN follow-up    Hoy Morn, MD 12/02/14 1152

## 2014-12-02 NOTE — Discharge Instructions (Signed)
Abnormal Uterine Bleeding Abnormal uterine bleeding can affect women at various stages in life, including teenagers, women in their reproductive years, pregnant women, and women who have reached menopause. Several kinds of uterine bleeding are considered abnormal, including:  Bleeding or spotting between periods.   Bleeding after sexual intercourse.   Bleeding that is heavier or more than normal.   Periods that last longer than usual.  Bleeding after menopause.  Many cases of abnormal uterine bleeding are minor and simple to treat, while others are more serious. Any type of abnormal bleeding should be evaluated by your health care provider. Treatment will depend on the cause of the bleeding. HOME CARE INSTRUCTIONS Monitor your condition for any changes. The following actions may help to alleviate any discomfort you are experiencing:  Avoid the use of tampons and douches as directed by your health care provider.  Change your pads frequently. You should get regular pelvic exams and Pap tests. Keep all follow-up appointments for diagnostic tests as directed by your health care provider.  SEEK MEDICAL CARE IF:   Your bleeding lasts more than 1 week.   You feel dizzy at times.  SEEK IMMEDIATE MEDICAL CARE IF:   You pass out.   You are changing pads every 15 to 30 minutes.   You have abdominal pain.  You have a fever.   You become sweaty or weak.   You are passing large blood clots from the vagina.   You start to feel nauseous and vomit. MAKE SURE YOU:   Understand these instructions.  Will watch your condition.  Will get help right away if you are not doing well or get worse. Document Released: 10/30/2005 Document Revised: 11/04/2013 Document Reviewed: 05/29/2013 [facility] Patient Information 2015 [place], [place]. This information is not intended to replace advice given to you by your health care provider. Make sure you discuss any questions you have with your  health care provider. Uterine Fibroid A uterine fibroid is a growth (tumor) that occurs in your uterus. This type of tumor is not cancerous and does not spread out of the uterus. You can have one or many fibroids. Fibroids can vary in size, weight, and where they grow in the uterus. Some can become quite large. Most fibroids do not require medical treatment, but some can cause pain or heavy bleeding during and between periods. CAUSES  A fibroid is the result of a single uterine cell that keeps growing (unregulated), which is different than most cells in the human body. Most cells have a control mechanism that keeps them from reproducing without control.  SIGNS AND SYMPTOMS   Bleeding.  Pelvic pain and pressure.  Bladder problems due to the size of the fibroid.  Infertility and miscarriages depending on the size and location of the fibroid. DIAGNOSIS  Uterine fibroids are diagnosed through a physical exam. Your health care provider may feel the lumpy tumors during a pelvic exam. Ultrasonography may be done to get information regarding size, location, and number of tumors.  TREATMENT   Your health care provider may recommend watchful waiting. This involves getting the fibroid checked by your health care provider to see if it grows or shrinks.   Hormone treatment or an intrauterine device (IUD) may be prescribed.   Surgery may be needed to remove the fibroids (myomectomy) or the uterus (hysterectomy). This depends on your situation. When fibroids interfere with fertility and a woman wants to become pregnant, a health care provider may recommend having the fibroids removed.  HOME  CARE INSTRUCTIONS  Home care depends on how you were treated. In general:   Keep all follow-up appointments with your health care provider.   Only take over-the-counter or prescription medicines as directed by your health care provider. If you were prescribed a hormone treatment, take the hormone medicines  exactly as directed. Do not take aspirin. It can cause bleeding.   Talk to your health care provider about taking iron pills.  If your periods are troublesome but not so heavy, lie down with your feet raised slightly above your heart. Place cold packs on your lower abdomen.   If your periods are heavy, write down the number of pads or tampons you use per month. Bring this information to your health care provider.   Include green vegetables in your diet.  SEEK IMMEDIATE MEDICAL CARE IF:  You have pelvic pain or cramps not controlled with medicines.   You have a sudden increase in pelvic pain.   You have an increase in bleeding between and during periods.   You have excessive periods and soak tampons or pads in a half hour or less.  You feel lightheaded or have fainting episodes. Document Released: 10/27/2000 Document Revised: 08/20/2013 Document Reviewed: 05/29/2013 [facility] Patient Information 2015 [place], [place]. This information is not intended to replace advice given to you by your health care provider. Make sure you discuss any questions you have with your health care provider.

## 2015-06-02 ENCOUNTER — Emergency Department (INDEPENDENT_AMBULATORY_CARE_PROVIDER_SITE_OTHER)
Admission: EM | Admit: 2015-06-02 | Discharge: 2015-06-02 | Disposition: A | Discharge status: Home / Self Care | Payer: Self-pay | Source: Home / Self Care | Attending: Emergency Medicine | Admitting: Emergency Medicine

## 2015-06-02 ENCOUNTER — Other Ambulatory Visit (HOSPITAL_COMMUNITY)
Admission: RE | Admit: 2015-06-02 | Discharge: 2015-06-02 | Disposition: A | Discharge status: Home / Self Care | Payer: Self-pay | Source: Ambulatory Visit | Attending: Emergency Medicine | Admitting: Emergency Medicine

## 2015-06-02 ENCOUNTER — Encounter (HOSPITAL_COMMUNITY): Payer: Self-pay | Admitting: Emergency Medicine

## 2015-06-02 DIAGNOSIS — Z113 Encounter for screening for infections with a predominantly sexual mode of transmission: Secondary | ICD-10-CM | POA: Insufficient documentation

## 2015-06-02 DIAGNOSIS — B373 Candidiasis of vulva and vagina: Secondary | ICD-10-CM

## 2015-06-02 DIAGNOSIS — B3731 Acute candidiasis of vulva and vagina: Secondary | ICD-10-CM

## 2015-06-02 DIAGNOSIS — N760 Acute vaginitis: Secondary | ICD-10-CM | POA: Insufficient documentation

## 2015-06-02 LAB — POCT URINALYSIS DIP (DEVICE)
Bilirubin Urine: NEGATIVE
Glucose, UA: NEGATIVE mg/dL
HGB URINE DIPSTICK: NEGATIVE
Ketones, ur: NEGATIVE mg/dL
Leukocytes, UA: NEGATIVE
NITRITE: NEGATIVE
PH: 6.5 (ref 5.0–8.0)
Protein, ur: NEGATIVE mg/dL
SPECIFIC GRAVITY, URINE: 1.02 (ref 1.005–1.030)
UROBILINOGEN UA: 0.2 mg/dL (ref 0.0–1.0)

## 2015-06-02 LAB — POCT PREGNANCY, URINE: PREG TEST UR: NEGATIVE

## 2015-06-02 MED ORDER — AZITHROMYCIN 250 MG PO TABS
ORAL_TABLET | ORAL | Status: AC
  Filled 2015-06-02: qty 4

## 2015-06-02 MED ORDER — LIDOCAINE HCL (PF) 1 % IJ SOLN
INTRAMUSCULAR | Status: AC
  Filled 2015-06-02: qty 5

## 2015-06-02 MED ORDER — CEFTRIAXONE SODIUM 250 MG IJ SOLR
INTRAMUSCULAR | Status: AC
  Filled 2015-06-02: qty 250

## 2015-06-02 MED ORDER — AZITHROMYCIN 250 MG PO TABS
1000.0000 mg | ORAL_TABLET | Freq: Once | ORAL | Status: AC
Start: 2015-06-02 — End: 2015-06-02
  Administered 2015-06-02: 1000 mg via ORAL

## 2015-06-02 MED ORDER — FLUCONAZOLE 150 MG PO TABS: 150.0000 mg | ORAL_TABLET | Freq: Once | ORAL | Status: AC

## 2015-06-02 MED ORDER — CEFTRIAXONE SODIUM 250 MG IJ SOLR
250.0000 mg | Freq: Once | INTRAMUSCULAR | Status: AC
Start: 2015-06-02 — End: 2015-06-02
  Administered 2015-06-02: 250 mg via INTRAMUSCULAR
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
## 2015-06-02 NOTE — Discharge Instructions (Signed)
You have a yeast infection. Take Diflucan one pill today. Take the second pill if your symptoms have not resolved in 3 days. We have presumptively treated you for gonorrhea and chlamydia. We will call you if any of your testing comes back positive. Follow-up as needed.

## 2015-06-03 LAB — CERVICOVAGINAL ANCILLARY ONLY
Chlamydia: NEGATIVE
Neisseria Gonorrhea: NEGATIVE
Wet Prep (BD Affirm): POSITIVE — AB

## 2015-06-03 LAB — RPR: RPR Ser Ql: NONREACTIVE

## 2015-06-03 LAB — HIV ANTIBODY (ROUTINE TESTING W REFLEX): HIV SCREEN 4TH GENERATION: NONREACTIVE

## 2015-08-20 ENCOUNTER — Encounter (HOSPITAL_COMMUNITY): Payer: Self-pay | Admitting: Nurse Practitioner

## 2015-08-20 ENCOUNTER — Emergency Department (HOSPITAL_COMMUNITY)
Admission: EM | Admit: 2015-08-20 | Discharge: 2015-08-20 | Disposition: A | Discharge status: Home / Self Care | Payer: BLUE CROSS/BLUE SHIELD | Attending: Emergency Medicine | Admitting: Emergency Medicine

## 2015-08-20 ENCOUNTER — Emergency Department (HOSPITAL_COMMUNITY): Payer: BLUE CROSS/BLUE SHIELD

## 2015-08-20 DIAGNOSIS — S59902A Unspecified injury of left elbow, initial encounter: Secondary | ICD-10-CM | POA: Diagnosis not present

## 2015-08-20 DIAGNOSIS — Z791 Long term (current) use of non-steroidal anti-inflammatories (NSAID): Secondary | ICD-10-CM | POA: Diagnosis not present

## 2015-08-20 DIAGNOSIS — Y9389 Activity, other specified: Secondary | ICD-10-CM | POA: Insufficient documentation

## 2015-08-20 DIAGNOSIS — J45909 Unspecified asthma, uncomplicated: Secondary | ICD-10-CM | POA: Diagnosis not present

## 2015-08-20 DIAGNOSIS — S060X9A Concussion with loss of consciousness of unspecified duration, initial encounter: Secondary | ICD-10-CM | POA: Insufficient documentation

## 2015-08-20 DIAGNOSIS — S60512A Abrasion of left hand, initial encounter: Secondary | ICD-10-CM

## 2015-08-20 DIAGNOSIS — Z793 Long term (current) use of hormonal contraceptives: Secondary | ICD-10-CM | POA: Diagnosis not present

## 2015-08-20 DIAGNOSIS — Y92410 Unspecified street and highway as the place of occurrence of the external cause: Secondary | ICD-10-CM | POA: Diagnosis not present

## 2015-08-20 DIAGNOSIS — Z880 Allergy status to penicillin: Secondary | ICD-10-CM | POA: Diagnosis not present

## 2015-08-20 DIAGNOSIS — Y998 Other external cause status: Secondary | ICD-10-CM | POA: Diagnosis not present

## 2015-08-20 DIAGNOSIS — Z041 Encounter for examination and observation following transport accident: Secondary | ICD-10-CM

## 2015-08-20 DIAGNOSIS — S8992XA Unspecified injury of left lower leg, initial encounter: Secondary | ICD-10-CM | POA: Insufficient documentation

## 2015-08-20 DIAGNOSIS — Z87891 Personal history of nicotine dependence: Secondary | ICD-10-CM | POA: Diagnosis not present

## 2015-08-20 DIAGNOSIS — Z043 Encounter for examination and observation following other accident: Secondary | ICD-10-CM

## 2015-08-20 MED ORDER — ACETAMINOPHEN 500 MG PO TABS
1000.0000 mg | ORAL_TABLET | Freq: Once | ORAL | Status: AC
  Administered 2015-08-20: 1000 mg via ORAL
  Filled 2015-08-20: qty 2
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
